# Patient Record
Sex: Female | Born: 1965 | Hispanic: No | Marital: Married | Smoking: Never smoker
Health system: Southern US, Community
[De-identification: ages and names within clinical notes are randomized; demographics above are authoritative.]

## PROBLEM LIST (undated history)

## (undated) DIAGNOSIS — T7840XA Allergy, unspecified, initial encounter: Secondary | ICD-10-CM

## (undated) DIAGNOSIS — R51 Headache: Secondary | ICD-10-CM

## (undated) DIAGNOSIS — K219 Gastro-esophageal reflux disease without esophagitis: Secondary | ICD-10-CM

## (undated) DIAGNOSIS — G8929 Other chronic pain: Secondary | ICD-10-CM

## (undated) DIAGNOSIS — R519 Headache, unspecified: Secondary | ICD-10-CM

## (undated) DIAGNOSIS — A159 Respiratory tuberculosis unspecified: Secondary | ICD-10-CM

## (undated) HISTORY — PX: TONSILLECTOMY: SUR1361

## (undated) HISTORY — DX: Headache, unspecified: R51.9

## (undated) HISTORY — PX: POLYPECTOMY: SHX149

## (undated) HISTORY — DX: Allergy, unspecified, initial encounter: T78.40XA

## (undated) HISTORY — PX: UPPER GASTROINTESTINAL ENDOSCOPY: SHX188

## (undated) HISTORY — DX: Respiratory tuberculosis unspecified: A15.9

## (undated) HISTORY — DX: Headache: R51

## (undated) HISTORY — DX: Gastro-esophageal reflux disease without esophagitis: K21.9

## (undated) HISTORY — DX: Other chronic pain: G89.29

## (undated) HISTORY — PX: WISDOM TOOTH EXTRACTION: SHX21

---

## 2002-11-05 ENCOUNTER — Other Ambulatory Visit: Admission: RE | Admit: 2002-11-05 | Discharge: 2002-11-05 | Payer: Self-pay | Admitting: Obstetrics and Gynecology

## 2004-01-14 ENCOUNTER — Other Ambulatory Visit: Admission: RE | Admit: 2004-01-14 | Discharge: 2004-01-14 | Payer: Self-pay | Admitting: Obstetrics and Gynecology

## 2005-03-08 ENCOUNTER — Other Ambulatory Visit: Admission: RE | Admit: 2005-03-08 | Discharge: 2005-03-08 | Payer: Self-pay | Admitting: Obstetrics and Gynecology

## 2007-05-24 ENCOUNTER — Encounter (INDEPENDENT_AMBULATORY_CARE_PROVIDER_SITE_OTHER): Payer: Self-pay | Admitting: Obstetrics and Gynecology

## 2007-05-24 ENCOUNTER — Inpatient Hospital Stay (HOSPITAL_COMMUNITY): Admission: RE | Admit: 2007-05-24 | Discharge: 2007-05-26 | Payer: Self-pay | Admitting: Obstetrics and Gynecology

## 2007-05-24 HISTORY — PX: LAPAROSCOPIC ASSISTED VAGINAL HYSTERECTOMY: SHX5398

## 2010-08-24 NOTE — Op Note (Signed)
NAME:  Alexandria Bowman, Alexandria Bowman              ACCOUNT NO.:  [id number]   MEDICAL RECORD NO.:  [id number]          PATIENT TYPE:  INP   LOCATION:  9316                          FACILITY:  [facility]   PHYSICIAN:  Michelle L. Grewal, M.D.DATE OF BIRTH:  [birth year]   DATE OF PROCEDURE:  05/24/2007  DATE OF DISCHARGE:                               OPERATIVE REPORT   PREOPERATIVE DIAGNOSIS:  Pelvic pain and history of endometriosis.   POSTOPERATIVE DIAGNOSIS:  Pelvic pain and history of endometriosis and  possible adenomyosis.   PROCEDURE:  Laparoscopic assisted vaginal hysterectomy and bilateral  salpingo-oophorectomy.   SURGEON:  Michelle L. Vincente Poli, M.D.   ASSISTANT:  Zelphia Cairo, M.D.   ANESTHESIA:  General.   DRAINS:  Foley.   ESTIMATED BLOOD LOSS:  200 mL approximately.   PROCEDURE IN DETAIL:  The patient was taken to the operating room where  she was identified and informed consent was obtained.  She was then  intubated in the operating room without difficulty.  She was prepped and  draped in the usual sterile fashion.  A Foley catheter was inserted and  draining clear urine.  Attention was turned to the abdomen after a  uterine manipulator was inserted. Local was infiltrated at the umbilicus  and just above the pubic symphysis. A small infraumbilical incision was  made.  The Veress needle was inserted and pneumoperitoneum was then  performed.  The Veress needle was removed.  An 11 mm trocar was inserted  one time. The laparoscope was introduced through the trocar sheath.  Everything appeared normal.  The patient was then placed in  Trendelenburg position.  A 5 mm trocar was placed suprapubically under  direct visualization.  Exam of the abdomen and pelvis revealed the  following.  There was evidence of old endometriosis in the cul-de-sac  that appeared to have resolved with some scarring in the cul-de-sac.  The uterus was boggy and I did notice when I inserted the uterine  manipulator  that she had a fair amount of descensus in the vagina.  There appeared to be congested vessels around the uterus.  She possibly  could have had adenomyosis, but of course, that will be pathology  pending.  The ovaries appeared normal.  The uterus and fallopian tubes  appeared normal.  We then first placed a grasper, identified the  fimbriated end on the right side, lifted it up, and identified the  ovary.  I then noticed the ureter and followed the course of the ureter.  We placed the gyrus instrument just beneath the ovary and made sure it  was very clear of the ureter.  We then transected this with excellent  hemostasis and carried that down to the round ligament.  There was no  bleeding, whatsoever.  This was done with excellent hemostasis.  The  ureter was peristalsing normally.  This was then done on the left side  in an identical fashion and noticed normal ureteral peristalsis after  this portion of the procedure. At this point, I then released the  pneumoperitoneum, kept the trocars in the abdomen, and went down  vaginally.  I placed a weighted speculum in the vagina.  A  circumferential incision was made around the cervix.  The posterior cul-  de-sac was entered sharply.  The anterior cul-de-sac was entered  sharply.  This was done without difficulty whatsoever.  Curved Heaney  clamps were placed just beside the cervix, each pedicle was cut, and  suture ligated using an 0 Vicryl suture.  We walked our way up the broad  ligament.  Each pedicle was secured using 0 Vicryl suture.  We then  retroflexed the uterus and removed the specimen.  The remainder of the  pedicles that had been clamped were then suture ligated using 0 Vicryl  suture.  There was no bleeding noted.  The posterior cuff was closed in  a running locked stitch from 3 o'clock to 9 o'clock using 0 Vicryl  suture.  The cuff was closed completely from anterior to posterior in a  running locked stitch using 0 Vicryl suture.   At this point, urine was  noted to be clear.  I then changed my gloves and went back up to the  abdomen in usual sterile fashion and replaced the pneumoperitoneum.  The  patient was then gently placed in Trendelenburg again for several  minutes and the pelvis was irrigated.  There was a small amount of blood  in the cul-de-sac. After irrigation with the [place], all pedicles were  inspected and there was no bleeding, whatsoever.  The vaginal cuff was  completely dry.  The pneumoperitoneum was released.  The pelvis was  inspected with release of the pneumoperitoneum and there was no bleeding  noted.  The trocars were removed after most of the gas was removed.  The  incisions were closed with 3-0 Vicryl interrupted and Dermabond skin  adhesive.  All sponge, lap and instrument counts were correct x2.  The  patient went to the recovery room in stable condition.      Michelle L. Vincente Poli, M.D.  Electronically Signed     MLG/MEDQ  D:  05/24/2007  T:  05/26/2007  Job:  [id number]

## 2010-08-27 NOTE — Discharge Summary (Signed)
NAME:  Alexandria Bowman, Alexandria Bowman              ACCOUNT NO.:  [id number]   MEDICAL RECORD NO.:  [id number]          PATIENT TYPE:  INP   LOCATION:  9316                          FACILITY:  [facility]   PHYSICIAN:  Zelphia Cairo, MD    DATE OF BIRTH:  1965-11-08   DATE OF ADMISSION:  05/24/2007  DATE OF DISCHARGE:  05/26/2007                               DISCHARGE SUMMARY   ADMISSION DIAGNOSIS:  Chronic pelvic pain.   PLANNED PROCEDURES:  Laparoscopic-assisted vaginal hysterectomy with  bilateral salpingo-oophorectomy.   HOSPITAL COURSE:  Alexandria Bowman was admitted to the hospital for  laparoscopic-assisted vaginal hysterectomy and bilateral salpingo-  oophorectomy.  Please see operative report for further details of this  surgery.  Following surgery, she had an uneventful postoperative course.  Her pain was well controlled with oral medications.  She remained  afebrile with stable vital signs.  Her Foley catheter was discontinued  following surgery, and she was able to urinate without difficulty.  Hemoglobin was stable at 9.7.  On postoperative day #2, her pain was  well controlled.  She was having some itching with Percocet, and  therefore, a prescription for Darvocet was given.  She was tolerating a  regular diet without nausea and vomiting.  She was ambulating and  urinating without difficulty.  She remained afebrile, and her incision  was clean, dry, and intact.  Abdomen was soft and nontender.  She was  then discharged home with plan to follow up in 1 week with Dr. Vincente Poli.      Zelphia Cairo, MD  Electronically Signed     GA/MEDQ  D:  06/26/2007  T:  06/26/2007  Job:  [id number]

## 2010-09-09 ENCOUNTER — Encounter: Payer: Self-pay | Admitting: Gastroenterology

## 2010-10-20 ENCOUNTER — Encounter: Payer: Self-pay | Admitting: Gastroenterology

## 2010-10-20 ENCOUNTER — Ambulatory Visit (INDEPENDENT_AMBULATORY_CARE_PROVIDER_SITE_OTHER): Payer: BC Managed Care – PPO | Admitting: Gastroenterology

## 2010-10-20 VITALS — BP 122/68 | HR 74 | Ht 65.0 in | Wt 182.0 lb

## 2010-10-20 DIAGNOSIS — K219 Gastro-esophageal reflux disease without esophagitis: Secondary | ICD-10-CM

## 2010-10-20 DIAGNOSIS — R1319 Other dysphagia: Secondary | ICD-10-CM

## 2010-10-20 DIAGNOSIS — R1013 Epigastric pain: Secondary | ICD-10-CM

## 2010-10-20 MED ORDER — OMEPRAZOLE 20 MG PO CPDR: 20.0000 mg | DELAYED_RELEASE_CAPSULE | Freq: Every day | ORAL | Status: DC

## 2010-10-20 NOTE — Patient Instructions (Signed)
Patient advised to avoid spicy, acidic, citrus, chocolate, mints, fruit and fruit juices.  Limit the intake of caffeine, alcohol and Soda.  Don't exercise too soon after eating.  Don't lie down within 3-4 hours of eating.  Elevate the head of your bed. You have been scheduled for a Upper Endoscopy. Separate instructions given. You have also been scheduled for a Barium Swallow with tablet at [facility]. Please arrive 15 minutes early. Nothing to eat or drink 3 hours prior to test. Pick up your prescription from your pharmacy.

## 2010-10-20 NOTE — Progress Notes (Signed)
History of Present Illness: This is a 45 year old female who is a 5 to 6 year history of frequent heartburn, regurgitation, frequent belching, intermittent upper and generalized abnormal pain associated with significant bloating and occasional urgent bowel movements.She recently started taking a probiotic without much change in symptoms. She has not tried acid suppressants except for intermittent TUMS which have not been effective. She denies constipation, diarrhea, melena, hematochezia, weight loss, vomiting. She denies NSAID usage.   Past Medical History  Diagnosis Date  . Endometriosis   . Chronic headaches   . GERD (gastroesophageal reflux disease)    Past Surgical History  Procedure Date  . Laparoscopic assisted vaginal hysterectomy 05/24/2007  . Tonsillectomy     reports that she has never smoked. She has never used smokeless tobacco. She reports that she does not drink alcohol or use illicit drugs. family history includes Diabetes in her father; Kidney disease in her maternal grandmother; and Uterine cancer in her maternal grandmother.  There is no history of Colon cancer. Allergies  Allergen Reactions  . Erythromycin   . Penicillins    Outpatient Encounter Prescriptions as of 10/20/2010  Medication Sig Dispense Refill  . AMBULATORY NON FORMULARY MEDICATION Estriol Micronized Cream. Use as directed       . Probiotic Product (PROBIOTIC FORMULA PO) Take by mouth daily.        . progesterone (PROMETRIUM) 100 MG capsule One capsule by mouth at bedtime        Review of Systems: Back pain, vision changes, fatigue, night sweats, but intermittent hoarseness. Pertinent positive and negative review of systems were noted in the above HPI section. All other review of systems were otherwise negative.  Physical Exam: General: Well developed , well nourished, no acute distress Head: Normocephalic and atraumatic Eyes:  sclerae anicteric, EOMI Ears: Normal auditory acuity Mouth: No deformity or  lesions Neck: Supple, no masses or thyromegaly Lungs: Clear throughout to auscultation Heart: Regular rate and rhythm; no murmurs, rubs or bruits Abdomen: Soft, non tender and non distended. No masses, hepatosplenomegaly or hernias noted. Normal Bowel sounds Rectal: Deferred Musculoskeletal: Symmetrical with no gross deformities  Skin: No lesions on visible extremities Pulses:  Normal pulses noted Extremities: No clubbing, cyanosis, edema or deformities noted Neurological: Alert oriented x 4, grossly nonfocal Cervical Nodes:  No significant cervical adenopathy Inguinal Nodes: No significant inguinal adenopathy Psychological:  Alert and cooperative. Normal mood and affect  Assessment and Recommendations:  1. GERD with intermittent solid food dysphagia. Rule out esophagitis and a stricture. She also has frequent upper and generalized abdominal bloating associated with urgent bowel movements. There may be a component of irritable bowel syndrome. If her symptoms do not respond to acid suppression consider further evaluation with blood work and possibly imaging studies. Begin standard antireflux measures and omeprazole 20 mg every morning. Schedule barium esophagram. Schedule for endoscopy. The risks, benefits, and alternatives to endoscopy with possible biopsy and possible dilation were discussed with the patient and they consent to proceed.   2. Colorectal cancer screening. Average risk. Colonoscopy at age 12.

## 2010-10-22 ENCOUNTER — Telehealth: Payer: Self-pay | Admitting: *Deleted

## 2010-10-22 ENCOUNTER — Ambulatory Visit (HOSPITAL_COMMUNITY)
Admission: RE | Admit: 2010-10-22 | Discharge: 2010-10-22 | Disposition: A | Discharge status: Home / Self Care | Payer: BC Managed Care – PPO | Source: Ambulatory Visit | Attending: Gastroenterology | Admitting: Gastroenterology

## 2010-10-22 DIAGNOSIS — R1013 Epigastric pain: Secondary | ICD-10-CM | POA: Insufficient documentation

## 2010-10-22 DIAGNOSIS — K219 Gastro-esophageal reflux disease without esophagitis: Secondary | ICD-10-CM

## 2010-10-22 DIAGNOSIS — R1319 Other dysphagia: Secondary | ICD-10-CM

## 2010-10-22 DIAGNOSIS — R1310 Dysphagia, unspecified: Secondary | ICD-10-CM | POA: Insufficient documentation

## 2010-10-22 NOTE — Telephone Encounter (Signed)
Message copied by Leonette Monarch on Fri Oct 22, 2010  2:19 PM ------      Message from: Claudette Head T      Created: Fri Oct 22, 2010  1:21 PM       normal

## 2010-10-26 NOTE — Telephone Encounter (Signed)
Notified patient of Barium Swallow results.

## 2010-11-26 ENCOUNTER — Encounter: Payer: Self-pay | Admitting: Gastroenterology

## 2010-11-26 ENCOUNTER — Ambulatory Visit (AMBULATORY_SURGERY_CENTER): Payer: BC Managed Care – PPO | Admitting: Gastroenterology

## 2010-11-26 DIAGNOSIS — K219 Gastro-esophageal reflux disease without esophagitis: Secondary | ICD-10-CM

## 2010-11-26 DIAGNOSIS — R1013 Epigastric pain: Secondary | ICD-10-CM

## 2010-11-26 DIAGNOSIS — R1319 Other dysphagia: Secondary | ICD-10-CM

## 2010-11-26 MED ORDER — GLYCOPYRROLATE 2 MG PO TABS: 2.0000 mg | ORAL_TABLET | Freq: Two times a day (BID) | ORAL | Status: DC

## 2010-11-26 MED ORDER — SODIUM CHLORIDE 0.9 % IV SOLN: 500.0000 mL | INTRAVENOUS | Status: DC

## 2010-11-26 NOTE — Patient Instructions (Signed)
Discharge instructions given with verbal understanding. Normal examination. Resume previous medications. Call office next 2-3 days to schedule an office appointment for 4-6 week.

## 2010-11-29 ENCOUNTER — Telehealth: Payer: Self-pay

## 2010-11-29 NOTE — Telephone Encounter (Signed)

## 2010-11-30 ENCOUNTER — Encounter: Payer: BC Managed Care – PPO | Admitting: Gastroenterology

## 2010-12-31 LAB — CBC
Hemoglobin: 9.7 — ABNORMAL LOW
MCV: 89.5
Platelets: 163
Platelets: 254
RDW: 11.9
WBC: 5.6
WBC: 6.5

## 2010-12-31 LAB — PREGNANCY, URINE: Preg Test, Ur: NEGATIVE

## 2011-08-24 ENCOUNTER — Other Ambulatory Visit: Payer: Self-pay | Admitting: Obstetrics and Gynecology

## 2011-11-02 ENCOUNTER — Ambulatory Visit (INDEPENDENT_AMBULATORY_CARE_PROVIDER_SITE_OTHER): Payer: BC Managed Care – PPO | Admitting: Gastroenterology

## 2011-11-02 ENCOUNTER — Other Ambulatory Visit: Payer: BC Managed Care – PPO

## 2011-11-02 ENCOUNTER — Encounter: Payer: Self-pay | Admitting: Gastroenterology

## 2011-11-02 VITALS — BP 92/64 | HR 64 | Ht 65.0 in | Wt 160.0 lb

## 2011-11-02 DIAGNOSIS — R197 Diarrhea, unspecified: Secondary | ICD-10-CM

## 2011-11-02 DIAGNOSIS — K219 Gastro-esophageal reflux disease without esophagitis: Secondary | ICD-10-CM

## 2011-11-02 DIAGNOSIS — R109 Unspecified abdominal pain: Secondary | ICD-10-CM

## 2011-11-02 MED ORDER — GLYCOPYRROLATE 2 MG PO TABS: ORAL_TABLET | ORAL | Status: DC

## 2011-11-02 NOTE — Patient Instructions (Addendum)
Your physician has requested that you go to the basement for the following lab work before leaving today: Celiac panel. We have sent the following medications to your pharmacy for you to pick up at your convenience: Glycopyrrolate. cc: Marcelle Overlie, MD

## 2011-11-02 NOTE — Progress Notes (Signed)
History of Present Illness: This is a 46 year old female with GERD. She underwent endoscopy in 11/2010 that was normal. Her reflux symptoms have been under excellent control on daily omeprazole. She has frequent generalized abdominal cramping and urgent diarrhea. The symptoms are often brought on by meals but can occur at other times as well. She was treated with glycopyrrolate but this led to drowsiness and dry mouth so she discontinued it. Her symptoms are unchanged over the past year. A recent thyroid panel at Dr. Lynnell Dike office was negative. Denies weight loss, constipation, change in stool caliber, melena, hematochezia, nausea, vomiting, dysphagia, reflux symptoms, chest pain.  Current Medications, Allergies, Past Medical History, Past Surgical History, Family History and Social History were reviewed in [employer] record.  Physical Exam: General: Well developed , well nourished, no acute distress Head: Normocephalic and atraumatic Eyes:  sclerae anicteric, EOMI Ears: Normal auditory acuity Mouth: No deformity or lesions Lungs: Clear throughout to auscultation Heart: Regular rate and rhythm; no murmurs, rubs or bruits Abdomen: Soft, non tender and non distended. No masses, hepatosplenomegaly or hernias noted. Normal Bowel sounds Musculoskeletal: Symmetrical with no gross deformities  Pulses:  Normal pulses noted Extremities: No clubbing, cyanosis, edema or deformities noted Neurological: Alert oriented x 4, grossly nonfocal Psychological:  Alert and cooperative. Normal mood and affect  Assessment and Recommendations:  1. GERD.  Continue standard antireflux measures and omeprazole 20 mg every morning.   2. Abdominal pain, bloating and diarrhea. Presumed irritable bowel syndrome. Obtain a celiac panel and stool Hemoccults. Trial of a reduced dose of glycopyrrolate at 1 mg daily or twice a day. Consider a trial of probiotics or another anti-spasmodic. Return office  visit in 4-6 weeks. If symptoms have not adequately responded consider further evaluation with colonoscopy.  3. Colorectal cancer screening. Average risk. Colonoscopy at age 72.

## 2011-11-03 LAB — CELIAC PANEL 10
Gliadin IgG: 5.5 U/mL (ref <20)
IgA: 394 mg/dL — ABNORMAL HIGH (ref 69–380)

## 2011-11-07 ENCOUNTER — Other Ambulatory Visit: Payer: Self-pay | Admitting: Gastroenterology

## 2011-11-07 DIAGNOSIS — R1013 Epigastric pain: Secondary | ICD-10-CM

## 2011-11-07 DIAGNOSIS — K219 Gastro-esophageal reflux disease without esophagitis: Secondary | ICD-10-CM

## 2011-11-07 DIAGNOSIS — R1319 Other dysphagia: Secondary | ICD-10-CM

## 2011-11-07 MED ORDER — OMEPRAZOLE 20 MG PO CPDR: 20.0000 mg | DELAYED_RELEASE_CAPSULE | Freq: Every day | ORAL | Status: DC

## 2011-11-07 NOTE — Telephone Encounter (Signed)
Prescription sent to patient's pharmacy.

## 2012-08-24 ENCOUNTER — Other Ambulatory Visit: Payer: Self-pay | Admitting: Obstetrics and Gynecology

## 2012-11-22 ENCOUNTER — Other Ambulatory Visit: Payer: Self-pay | Admitting: Gastroenterology

## 2012-11-22 NOTE — Telephone Encounter (Signed)
NEEDS OFFICE VISIT FOR ANY FURTHER REFILLS! 

## 2013-02-09 ENCOUNTER — Other Ambulatory Visit: Payer: Self-pay | Admitting: Gastroenterology

## 2013-11-06 ENCOUNTER — Other Ambulatory Visit: Payer: Self-pay | Admitting: Obstetrics and Gynecology

## 2013-11-07 LAB — CYTOLOGY - PAP

## 2014-11-18 ENCOUNTER — Other Ambulatory Visit: Payer: Self-pay | Admitting: Obstetrics and Gynecology

## 2014-11-19 LAB — CYTOLOGY - PAP

## 2016-01-19 ENCOUNTER — Encounter: Payer: Self-pay | Admitting: Gastroenterology

## 2016-02-26 ENCOUNTER — Encounter: Payer: Self-pay | Admitting: Gastroenterology

## 2016-04-07 ENCOUNTER — Ambulatory Visit: Payer: Self-pay | Admitting: Gastroenterology

## 2016-04-19 ENCOUNTER — Ambulatory Visit (AMBULATORY_SURGERY_CENTER): Payer: Self-pay

## 2016-04-19 VITALS — Ht 64.0 in | Wt 186.6 lb

## 2016-04-19 DIAGNOSIS — Z1211 Encounter for screening for malignant neoplasm of colon: Secondary | ICD-10-CM

## 2016-04-19 MED ORDER — NA SULFATE-K SULFATE-MG SULF 17.5-3.13-1.6 GM/177ML PO SOLN
ORAL | 0 refills | Status: DC
Start: 2016-04-19 — End: 2016-06-14

## 2016-04-19 NOTE — Progress Notes (Signed)
Per pt, no allergies to soy or egg products.Pt not taking any weight loss meds or using  O2 at home. 

## 2016-05-04 ENCOUNTER — Encounter: Payer: Self-pay | Admitting: Gastroenterology

## 2016-06-03 ENCOUNTER — Encounter: Payer: Self-pay | Admitting: Gastroenterology

## 2016-06-14 ENCOUNTER — Ambulatory Visit (AMBULATORY_SURGERY_CENTER): Payer: BLUE CROSS/BLUE SHIELD | Admitting: Gastroenterology

## 2016-06-14 ENCOUNTER — Encounter: Payer: Self-pay | Admitting: Gastroenterology

## 2016-06-14 VITALS — BP 123/85 | HR 72 | Temp 98.6°F | Resp 13 | Ht 64.0 in | Wt 186.0 lb

## 2016-06-14 DIAGNOSIS — D123 Benign neoplasm of transverse colon: Secondary | ICD-10-CM | POA: Diagnosis not present

## 2016-06-14 DIAGNOSIS — Z1212 Encounter for screening for malignant neoplasm of rectum: Secondary | ICD-10-CM

## 2016-06-14 DIAGNOSIS — Z1211 Encounter for screening for malignant neoplasm of colon: Secondary | ICD-10-CM

## 2016-06-14 DIAGNOSIS — D125 Benign neoplasm of sigmoid colon: Secondary | ICD-10-CM

## 2016-06-14 HISTORY — PX: COLONOSCOPY: SHX174

## 2016-06-14 MED ORDER — HYOSCYAMINE SULFATE 0.125 MG SL SUBL: SUBLINGUAL_TABLET | SUBLINGUAL | 5 refills | Status: AC

## 2016-06-14 MED ORDER — SODIUM CHLORIDE 0.9 % IV SOLN: 500.0000 mL | INTRAVENOUS | Status: DC

## 2016-06-14 NOTE — Patient Instructions (Signed)
YOU HAD AN ENDOSCOPIC PROCEDURE TODAY AT [facility]:   Refer to the procedure report that was given to you for any specific questions about what was found during the examination.  If the procedure report does not answer your questions, please call your gastroenterologist to clarify.  If you requested that your care partner not be given the details of your procedure findings, then the procedure report has been included in a sealed envelope for you to review at your convenience later.  YOU SHOULD EXPECT: Some feelings of bloating in the abdomen. Passage of more gas than usual.  Walking can help get rid of the air that was put into your GI tract during the procedure and reduce the bloating. If you had a lower endoscopy (such as a colonoscopy or flexible sigmoidoscopy) you may notice spotting of blood in your stool or on the toilet paper. If you underwent a bowel prep for your procedure, you may not have a normal bowel movement for a few days.  Please Note:  You might notice some irritation and congestion in your nose or some drainage.  This is from the oxygen used during your procedure.  There is no need for concern and it should clear up in a day or so.  SYMPTOMS TO REPORT IMMEDIATELY:   Following lower endoscopy (colonoscopy or flexible sigmoidoscopy):  Excessive amounts of blood in the stool  Significant tenderness or worsening of abdominal pains  Swelling of the abdomen that is new, acute  Fever of 100F or higher  For urgent or emergent issues, a gastroenterologist can be reached at any hour by calling [phone].   DIET:  We do recommend a small meal at first, but then you may proceed to your regular diet.  Drink plenty of fluids but you should avoid alcoholic beverages for 24 hours.  ACTIVITY:  You should plan to take it easy for the rest of today and you should NOT DRIVE or use heavy machinery until tomorrow (because of the sedation medicines used during the test).     FOLLOW UP: Our staff will call the number listed on your records the next business day following your procedure to check on you and address any questions or concerns that you may have regarding the information given to you following your procedure. If we do not reach you, we will leave a message.  However, if you are feeling well and you are not experiencing any problems, there is no need to return our call.  We will assume that you have returned to your regular daily activities without incident.  If any biopsies were taken you will be contacted by phone or by letter within the next 1-3 weeks.  Please call us at [phone] if you have not heard about the biopsies in 3 weeks.    SIGNATURES/CONFIDENTIALITY: You and/or your care partner have signed paperwork which will be entered into your electronic medical record.  These signatures attest to the fact that that the information above on your After Visit Summary has been reviewed and is understood.  Full responsibility of the confidentiality of this discharge information lies with you and/or your care-partner.  MEDICATIONS:  Levsin (hyoscyamine) 0.125mg  1-2 tabs by mouth under the tongue every 4 hours as needed for abdominal pain and diarrhea. No Aspirin, Ibuprofen, Naproxen, or other non-steroidal anti-inflammatory drugs for 2 weeks after polyp removal. Continue present medications.

## 2016-06-14 NOTE — Progress Notes (Signed)
Called to room to assist during endoscopic procedure.  Patient ID and intended procedure confirmed with present staff. Received instructions for my participation in the procedure from the performing physician.  

## 2016-06-14 NOTE — Progress Notes (Signed)
Spontaneous respirations throughout. VSS. Resting comfortably. To PACU on room air. Report to  Sara RN. 

## 2016-06-14 NOTE — Op Note (Signed)
[facility] Patient Name: Alexandria Bowman Procedure Date: 06/14/2016 10:10 AM MRN: [id number] Endoscopist: Ladene Artist , MD Age: 51 Referring MD:  Date of Birth: 1965-08-04 Gender: Female Account #: [id number] Procedure:                Colonoscopy Indications:              Screening for colorectal malignant neoplasm, This                            is the patient's first colonoscopy Medicines:                Monitored Anesthesia Care Procedure:                Pre-Anesthesia Assessment:                           - Prior to the procedure, a History and Physical                            was performed, and patient medications and                            allergies were reviewed. The patient's tolerance of                            previous anesthesia was also reviewed. The risks                            and benefits of the procedure and the sedation                            options and risks were discussed with the patient.                            All questions were answered, and informed consent                            was obtained. Prior Anticoagulants: The patient has                            taken no previous anticoagulant or antiplatelet                            agents. ASA Grade Assessment: II - A patient with                            mild systemic disease. After reviewing the risks                            and benefits, the patient was deemed in                            satisfactory condition to undergo the procedure.  After obtaining informed consent, the colonoscope                            was passed under direct vision. Throughout the                            procedure, the patient's blood pressure, pulse, and                            oxygen saturations were monitored continuously. The                            Colonoscope was introduced through the anus and                            advanced to the the  cecum, identified by                            appendiceal orifice and ileocecal valve. The                            ileocecal valve, appendiceal orifice, and rectum                            were photographed. The quality of the bowel                            preparation was adequate after extensive lavage and                            suctioning. The colonoscopy was performed without                            difficulty. The patient tolerated the procedure                            well. Scope In: 10:25:32 AM Scope Out: 10:45:47 AM Scope Withdrawal Time: 0 hours 17 minutes 9 seconds  Total Procedure Duration: 0 hours 20 minutes 15 seconds  Findings:                 A 12 mm polyp was found in the sigmoid colon. The                            polyp was pedunculated. The polyp was removed with                            a hot snare. Resection and retrieval were complete.                           A 5 mm polyp was found in the sigmoid colon. The                            polyp was sessile. The polyp was  removed with a                            cold biopsy forceps. Resection and retrieval were                            complete.                           A 7 mm polyp was found in the transverse colon. The                            polyp was sessile. The polyp was removed with a                            cold snare. Resection and retrieval were complete.                           A diffuse area of mild melanosis was found in the                            right colon.                           The exam was otherwise without abnormality on                            direct and retroflexion views. Complications:            No immediate complications. Estimated blood loss:                            None. Estimated Blood Loss:     Estimated blood loss: none. Impression:               - One 12 mm polyp in the sigmoid colon, removed                            with a hot snare.  Resected and retrieved.                           - One 5 mm polyp in the sigmoid colon, removed with                            a cold biopsy forceps. Resected and retrieved.                           - One 7 mm polyp in the transverse colon, removed                            with a cold snare. Resected and retrieved.                           - Melanosis in the colon.                           -  The examination was otherwise normal on direct                            and retroflexion views. Recommendation:           - Repeat colonoscopy in 3 - 5 years with a more                            extensive bowel prep for surveillance, timing                            pending pathology review.                           - Patient has a contact number available for                            emergencies. The signs and symptoms of potential                            delayed complications were discussed with the                            patient. Return to normal activities tomorrow.                            Written discharge instructions were provided to the                            patient.                           - Resume previous diet.                           - Continue present medications.                           - Await pathology results.                           - No aspirin, ibuprofen, naproxen, or other                            non-steroidal anti-inflammatory drugs for 2 weeks                            after polyp removal.                           - Levsin (hyoscyamine) 0.125 mg 1-2 tabs PO/SL q 4                            hours prn abd pain, diarrhea, #40, 5 refills. Ladene Artist, MD 06/14/2016 10:52:02 AM This report has been signed electronically.

## 2016-06-15 ENCOUNTER — Telehealth: Payer: Self-pay

## 2016-06-15 NOTE — Telephone Encounter (Signed)
Left message on answering machine. 

## 2016-06-16 ENCOUNTER — Telehealth: Payer: Self-pay | Admitting: *Deleted

## 2016-06-16 NOTE — Telephone Encounter (Signed)
  Follow up Call-  Call back number 06/14/2016  Post procedure Call Back phone  # [phone] cell  Permission to leave phone message Yes  Some recent data might be hidden     Patient questions:  Do you have a fever, pain , or abdominal swelling? No. Pain Score  0 *  Have you tolerated food without any problems? Yes.    Have you been able to return to your normal activities? Yes.    Do you have any questions about your discharge instructions: Diet   No. Medications  No. Follow up visit  No.  Do you have questions or concerns about your Care? No.  Actions: * If pain score is 4 or above: No action needed, pain <4.

## 2016-06-29 ENCOUNTER — Encounter: Payer: Self-pay | Admitting: Gastroenterology

## 2019-02-13 ENCOUNTER — Other Ambulatory Visit: Payer: Self-pay | Admitting: Obstetrics and Gynecology

## 2019-02-13 DIAGNOSIS — R928 Other abnormal and inconclusive findings on diagnostic imaging of breast: Secondary | ICD-10-CM

## 2019-02-14 ENCOUNTER — Ambulatory Visit: Payer: BLUE CROSS/BLUE SHIELD

## 2019-02-14 ENCOUNTER — Other Ambulatory Visit: Payer: Self-pay

## 2019-02-14 ENCOUNTER — Ambulatory Visit
Admission: RE | Admit: 2019-02-14 | Discharge: 2019-02-14 | Disposition: A | Discharge status: Home / Self Care | Payer: BC Managed Care – PPO | Source: Ambulatory Visit | Attending: Obstetrics and Gynecology | Admitting: Obstetrics and Gynecology

## 2019-02-14 DIAGNOSIS — R928 Other abnormal and inconclusive findings on diagnostic imaging of breast: Secondary | ICD-10-CM

## 2020-02-10 ENCOUNTER — Other Ambulatory Visit: Payer: Self-pay

## 2020-02-10 ENCOUNTER — Ambulatory Visit (AMBULATORY_SURGERY_CENTER): Payer: Self-pay | Admitting: *Deleted

## 2020-02-10 VITALS — Ht 64.0 in | Wt 178.0 lb

## 2020-02-10 DIAGNOSIS — Z86010 Personal history of colonic polyps: Secondary | ICD-10-CM

## 2020-02-10 MED ORDER — PLENVU 140 G PO SOLR: 1.0000 | Freq: Once | ORAL | 0 refills | Status: AC

## 2020-02-10 NOTE — Progress Notes (Signed)
Patient is here in-person for PV. Patient denies any allergies to eggs or soy. Patient denies any problems with anesthesia/sedation. Patient denies any oxygen use at home. Patient denies taking any diet/weight loss medications or blood thinners. Patient is not being treated for MRSA or C-diff. Patient is aware of our care-partner policy and [id number]-81 safety protocol. COVID-19 vaccines completed on 01/04/2020 x3 , per patient.   Prep Prescription coupon given to the patient.

## 2020-02-12 ENCOUNTER — Encounter: Payer: Self-pay | Admitting: Gastroenterology

## 2020-02-24 ENCOUNTER — Ambulatory Visit (AMBULATORY_SURGERY_CENTER): Payer: BC Managed Care – PPO | Admitting: Gastroenterology

## 2020-02-24 ENCOUNTER — Encounter: Payer: Self-pay | Admitting: Gastroenterology

## 2020-02-24 ENCOUNTER — Other Ambulatory Visit: Payer: Self-pay

## 2020-02-24 VITALS — BP 108/71 | HR 86 | Temp 97.5°F | Resp 13 | Ht 64.0 in | Wt 178.0 lb

## 2020-02-24 DIAGNOSIS — Z86010 Personal history of colonic polyps: Secondary | ICD-10-CM

## 2020-02-24 MED ORDER — SODIUM CHLORIDE 0.9 % IV SOLN: 500.0000 mL | Freq: Once | INTRAVENOUS | Status: DC

## 2020-02-24 NOTE — Op Note (Signed)
[place] Patient Name: Alexandria Bowman Procedure Date: 02/24/2020 10:34 AM MRN: [id number] Endoscopist: Ladene Artist , MD Age: 53 Referring MD:  Date of Birth: 1965/11/27 Gender: Female Account #: [id number] Procedure:                Colonoscopy Indications:              Surveillance: Personal history of adenomatous                            polyps on last colonoscopy 3 years ago Medicines:                Monitored Anesthesia Care Procedure:                Pre-Anesthesia Assessment:                           - Prior to the procedure, a History and Physical                            was performed, and patient medications and                            allergies were reviewed. The patient's tolerance of                            previous anesthesia was also reviewed. The risks                            and benefits of the procedure and the sedation                            options and risks were discussed with the patient.                            All questions were answered, and informed consent                            was obtained. Prior Anticoagulants: The patient has                            taken no previous anticoagulant or antiplatelet                            agents. ASA Grade Assessment: II - A patient with                            mild systemic disease. After reviewing the risks                            and benefits, the patient was deemed in                            satisfactory condition to undergo the procedure.  After obtaining informed consent, the colonoscope                            was passed under direct vision. Throughout the                            procedure, the patient's blood pressure, pulse, and                            oxygen saturations were monitored continuously. The                            Colonoscope was introduced through the anus and                            advanced to the the  cecum, identified by                            appendiceal orifice and ileocecal valve. The                            ileocecal valve, appendiceal orifice, and rectum                            were photographed. The quality of the bowel                            preparation was good after extensive lavage,                            suction. The colonoscopy was performed without                            difficulty. The patient tolerated the procedure                            well. Scope In: 10:40:54 AM Scope Out: 11:01:32 AM Scope Withdrawal Time: 0 hours 16 minutes 35 seconds  Total Procedure Duration: 0 hours 20 minutes 38 seconds  Findings:                 The perianal and digital rectal examinations were                            normal.                           A patchy area of mild melanosis was found in the                            ascending colon and in the cecum.                           The exam was otherwise without abnormality on  direct and retroflexion views. Complications:            No immediate complications. Estimated blood loss:                            None. Estimated Blood Loss:     Estimated blood loss: none. Impression:               - Mild melanosis in the colon.                           - The examination was otherwise normal on direct                            and retroflexion views.                           - No specimens collected. Recommendation:           - Repeat colonoscopy in 5 years for surveillance                            with an extended bowel prep.                           - Patient has a contact number available for                            emergencies. The signs and symptoms of potential                            delayed complications were discussed with the                            patient. Return to normal activities tomorrow.                            Written discharge instructions were  provided to the                            patient.                           - Resume previous diet.                           - Continue present medications. Ladene Artist, MD 02/24/2020 11:04:52 AM This report has been signed electronically.

## 2020-02-24 NOTE — Progress Notes (Signed)
A/ox3, pleased with MAC, report to RN 

## 2020-02-24 NOTE — Patient Instructions (Signed)
Resume previous diet Continue current medications  YOU HAD AN ENDOSCOPIC PROCEDURE TODAY AT THE [facility]:   Refer to the procedure report that was given to you for any specific questions about what was found during the examination.  If the procedure report does not answer your questions, please call your gastroenterologist to clarify.  If you requested that your care partner not be given the details of your procedure findings, then the procedure report has been included in a sealed envelope for you to review at your convenience later.  YOU SHOULD EXPECT: Some feelings of bloating in the abdomen. Passage of more gas than usual.  Walking can help get rid of the air that was put into your GI tract during the procedure and reduce the bloating. If you had a lower endoscopy (such as a colonoscopy or flexible sigmoidoscopy) you may notice spotting of blood in your stool or on the toilet paper. If you underwent a bowel prep for your procedure, you may not have a normal bowel movement for a few days.  Please Note:  You might notice some irritation and congestion in your nose or some drainage.  This is from the oxygen used during your procedure.  There is no need for concern and it should clear up in a day or so.  SYMPTOMS TO REPORT IMMEDIATELY:  Following lower endoscopy (colonoscopy or flexible sigmoidoscopy):  Excessive amounts of blood in the stool  Significant tenderness or worsening of abdominal pains  Swelling of the abdomen that is new, acute  Fever of 100F or higher  For urgent or emergent issues, a gastroenterologist can be reached at any hour by calling [phone]. Do not use MyChart messaging for urgent concerns.   DIET:  We do recommend a small meal at first, but then you may proceed to your regular diet.  Drink plenty of fluids but you should avoid alcoholic beverages for 24 hours.  ACTIVITY:  You should plan to take it easy for the rest of today and you should NOT  DRIVE or use heavy machinery until tomorrow (because of the sedation medicines used during the test).    FOLLOW UP: Our staff will call the number listed on your records 48-72 hours following your procedure to check on you and address any questions or concerns that you may have regarding the information given to you following your procedure. If we do not reach you, we will leave a message.  We will attempt to reach you two times.  During this call, we will ask if you have developed any symptoms of COVID 19. If you develop any symptoms (ie: fever, flu-like symptoms, shortness of breath, cough etc.) before then, please call [phone].  If you test positive for Covid 19 in the 2 weeks post procedure, please call and report this information to us.    If any biopsies were taken you will be contacted by phone or by letter within the next 1-3 weeks.  Please call us at [phone] if you have not heard about the biopsies in 3 weeks.   SIGNATURES/CONFIDENTIALITY: You and/or your care partner have signed paperwork which will be entered into your electronic medical record.  These signatures attest to the fact that that the information above on your After Visit Summary has been reviewed and is understood.  Full responsibility of the confidentiality of this discharge information lies with you and/or your care-partner.  

## 2020-02-24 NOTE — Progress Notes (Signed)
VS by CW  No changes to medical or social hx since previsit.  

## 2020-02-26 ENCOUNTER — Telehealth: Payer: Self-pay

## 2020-02-26 NOTE — Telephone Encounter (Signed)
Left message on follow up call. 

## 2020-02-26 NOTE — Telephone Encounter (Signed)
  Follow up Call-  Call back number 02/24/2020  Post procedure Call Back phone  # [phone]  Permission to leave phone message Yes  Some recent data might be hidden     Patient questions:  Do you have a fever, pain , or abdominal swelling? No. Pain Score  0 *  Have you tolerated food without any problems? Yes.    Have you been able to return to your normal activities? Yes.    Do you have any questions about your discharge instructions: Diet   No. Medications  No. Follow up visit  No.  Do you have questions or concerns about your Care? No.  Actions: * If pain score is 4 or above: No action needed, pain <4.   1. Have you developed a fever since your procedure? No   2.   Have you had an respiratory symptoms (SOB or cough) since your procedure? No   3.   Have you tested positive for COVID 19 since your procedure? No   4.   Have you had any family members/close contacts diagnosed with the COVID 19 since your procedure?  No    If yes to any of these questions please route to Joylene John, RN and Joella Prince, RN

## 2020-03-23 ENCOUNTER — Other Ambulatory Visit: Payer: Self-pay | Admitting: Obstetrics and Gynecology

## 2020-03-23 DIAGNOSIS — R928 Other abnormal and inconclusive findings on diagnostic imaging of breast: Secondary | ICD-10-CM

## 2020-03-30 ENCOUNTER — Other Ambulatory Visit: Payer: Self-pay

## 2020-03-30 ENCOUNTER — Ambulatory Visit
Admission: RE | Admit: 2020-03-30 | Discharge: 2020-03-30 | Disposition: A | Discharge status: Home / Self Care | Payer: BC Managed Care – PPO | Source: Ambulatory Visit | Attending: Obstetrics and Gynecology | Admitting: Obstetrics and Gynecology

## 2020-03-30 DIAGNOSIS — R928 Other abnormal and inconclusive findings on diagnostic imaging of breast: Secondary | ICD-10-CM

## 2020-12-25 IMAGING — MG MM DIGITAL DIAGNOSTIC UNILAT*L* W/ TOMO W/ CAD
6 of 12 series · 6 of 36 positions shown · non-contrast
Comparison: Previous exam(s).

CLINICAL DATA: Patient presents today recall from screen for a
possible left breast asymmetry and a possible left breast mass.

EXAM:
DIGITAL DIAGNOSTIC UNILATERAL LEFT MAMMOGRAM WITH CAD AND TOMO

[L CC synth-2D (1 of 3)]
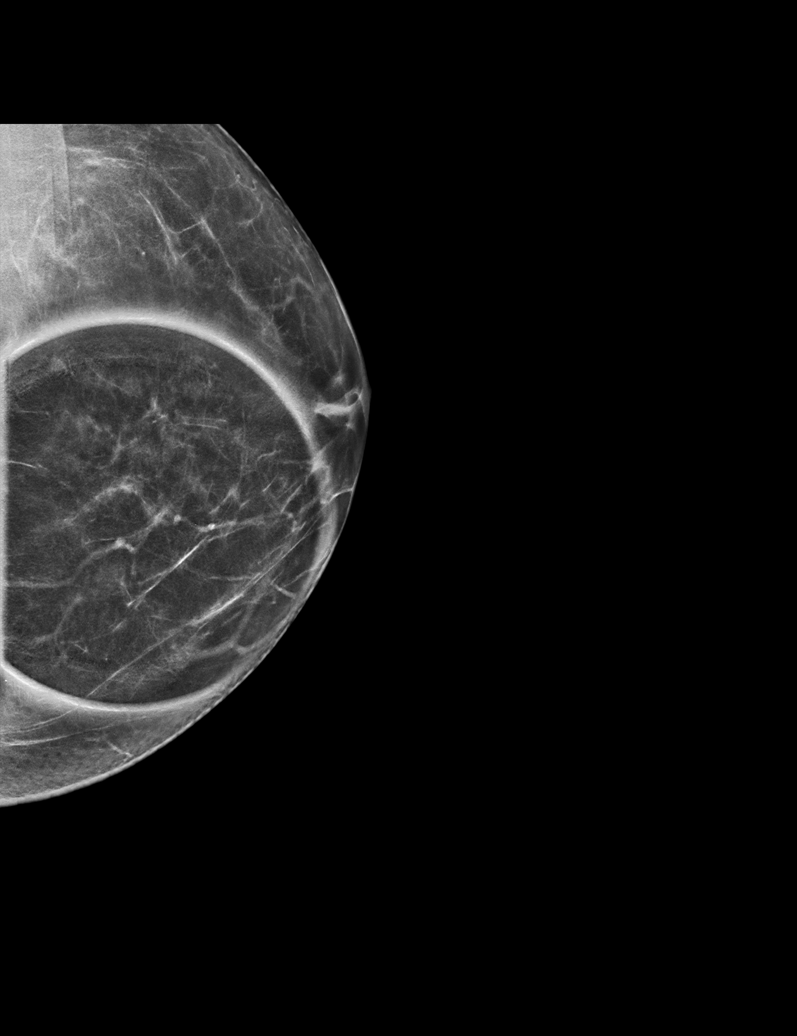

[L MLO synth-2D (1 of 2)]
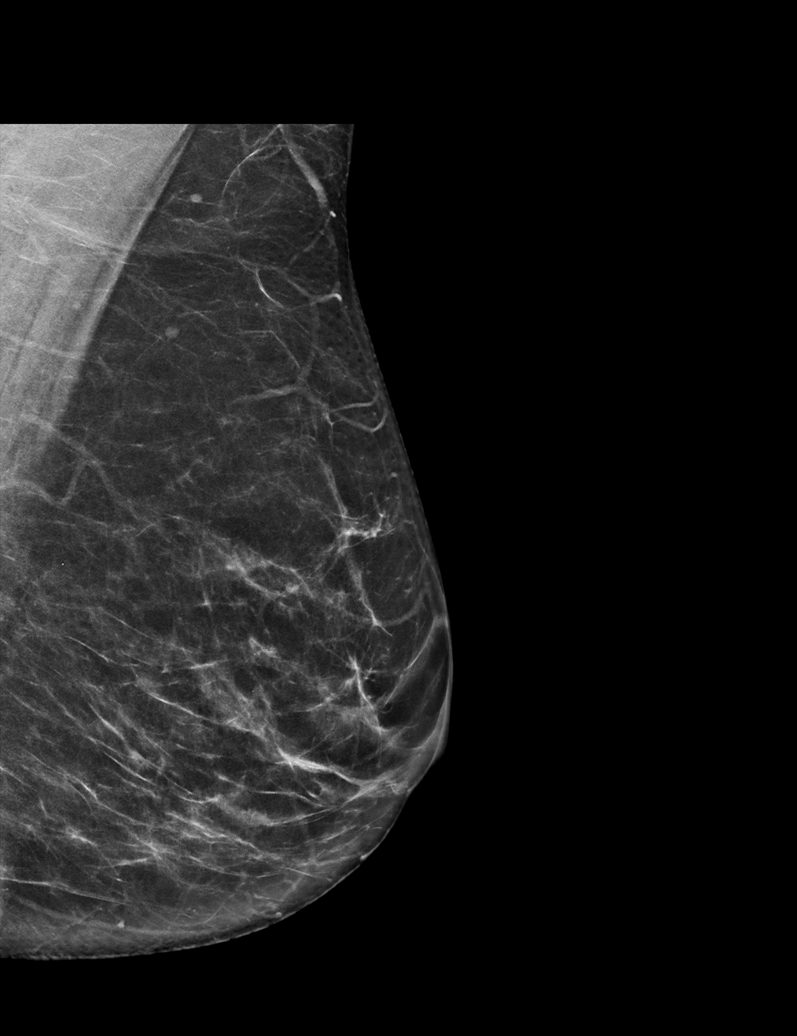

[L CC synth-2D (2 of 3)]
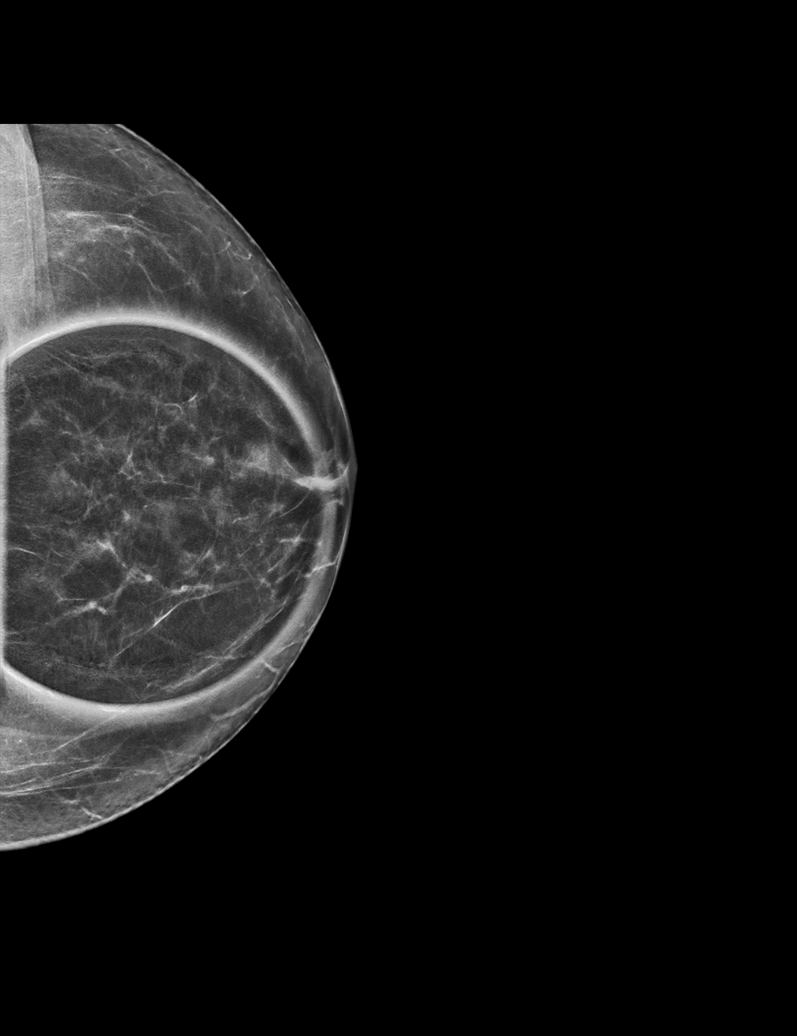

[L ML synth-2D]
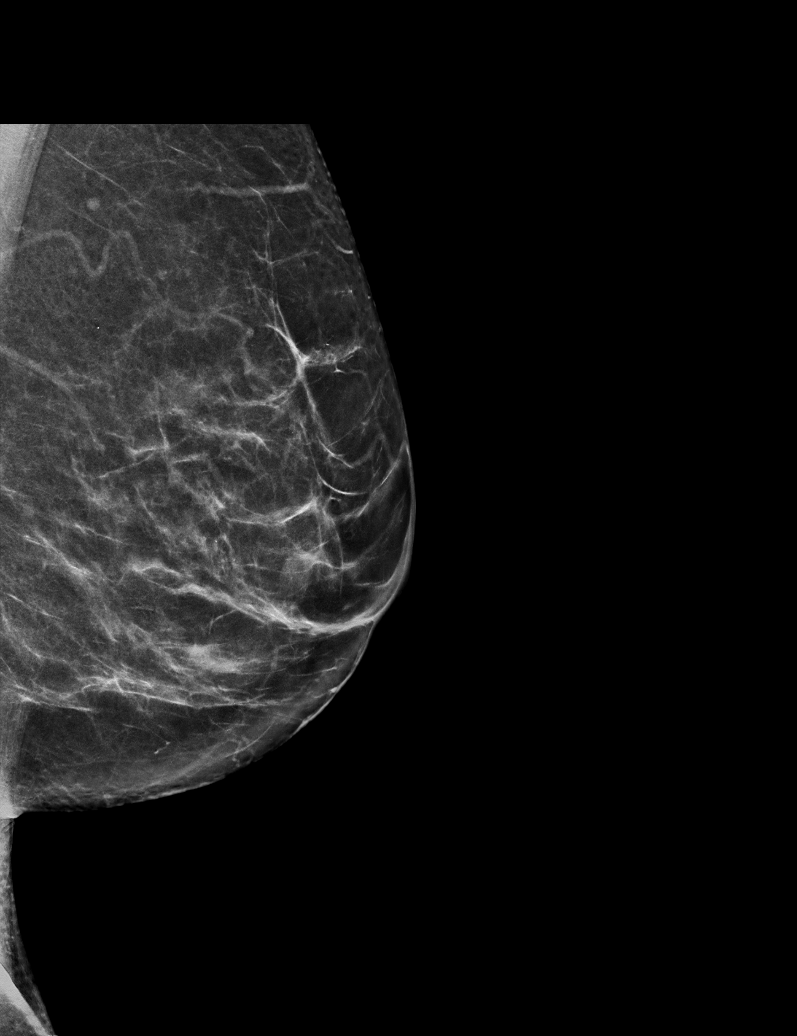

[L CC synth-2D (3 of 3)]
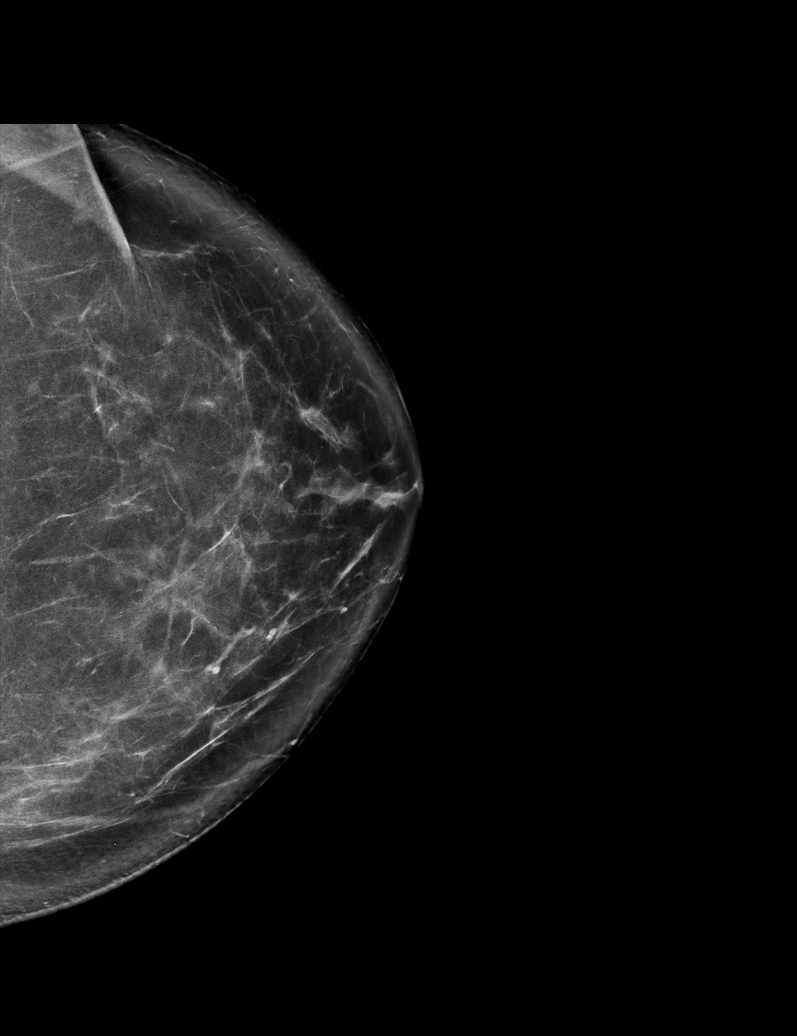

[L MLO synth-2D (2 of 2)]
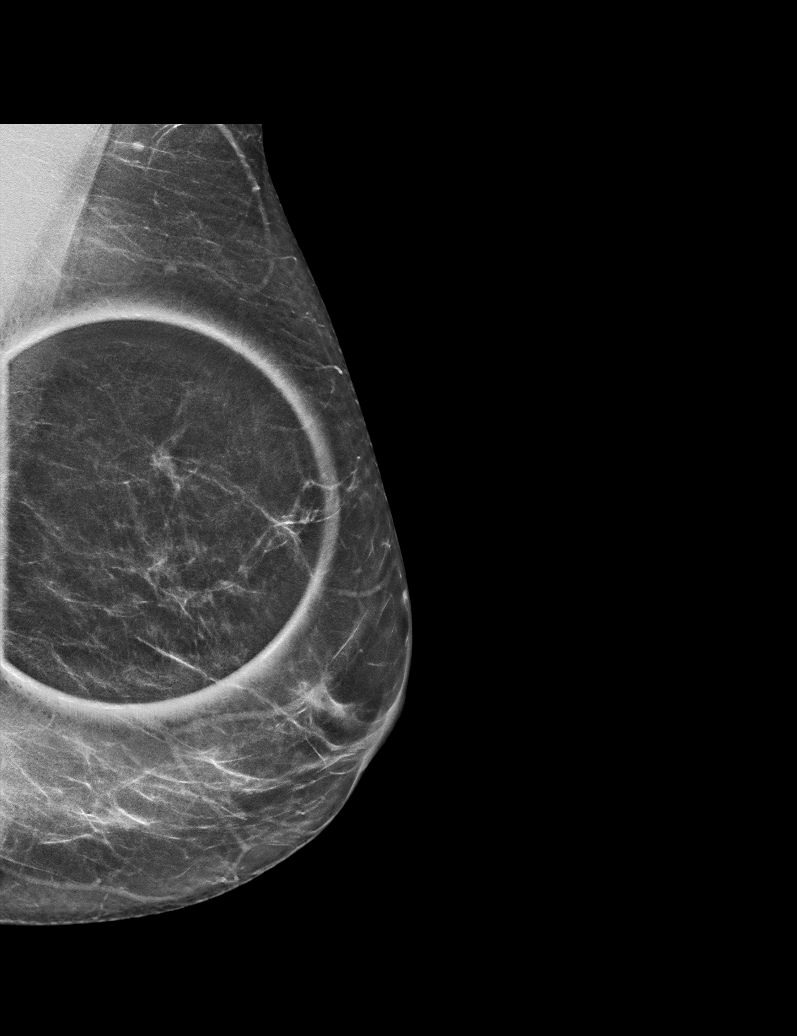

[6 of 36 positions shown; findings below may reference images not displayed]

ACR Breast Density Category b: There are scattered areas of
fibroglandular density.
FINDINGS: Additional spot compression tomosynthesis and full field views were
performed for the questioned asymmetry and questioned mass in the
left breast. On the additional imaging these findings do not
persist, and are consistent with overlapping fibroglandular tissue.
No mammographic evidence of malignancy in the left breast.

Mammographic images were processed with CAD.
IMPRESSION: No mammographic evidence of malignancy in the left breast.

RECOMMENDATION:
Screening mammogram in one year.(Code:[id number])

I have discussed the findings and recommendations with the patient.
If applicable, a reminder letter will be sent to the patient
regarding the next appointment.

BI-RADS CATEGORY  1: Negative.

## 2022-02-08 IMAGING — US US BREAST*L* LIMITED INC AXILLA
1 series · 6 of 6 positions shown · non-contrast
Comparison: Previous exam(s).

CLINICAL DATA: 54-year-old female for further evaluation of LEFT
breast mass on screening mammogram.

EXAM:
ULTRASOUND OF THE LEFT BREAST

[Series 1: us breast*left* limited inc axilla · 0.07mm/px · 6 of 6 slices shown]
[im 1/6]
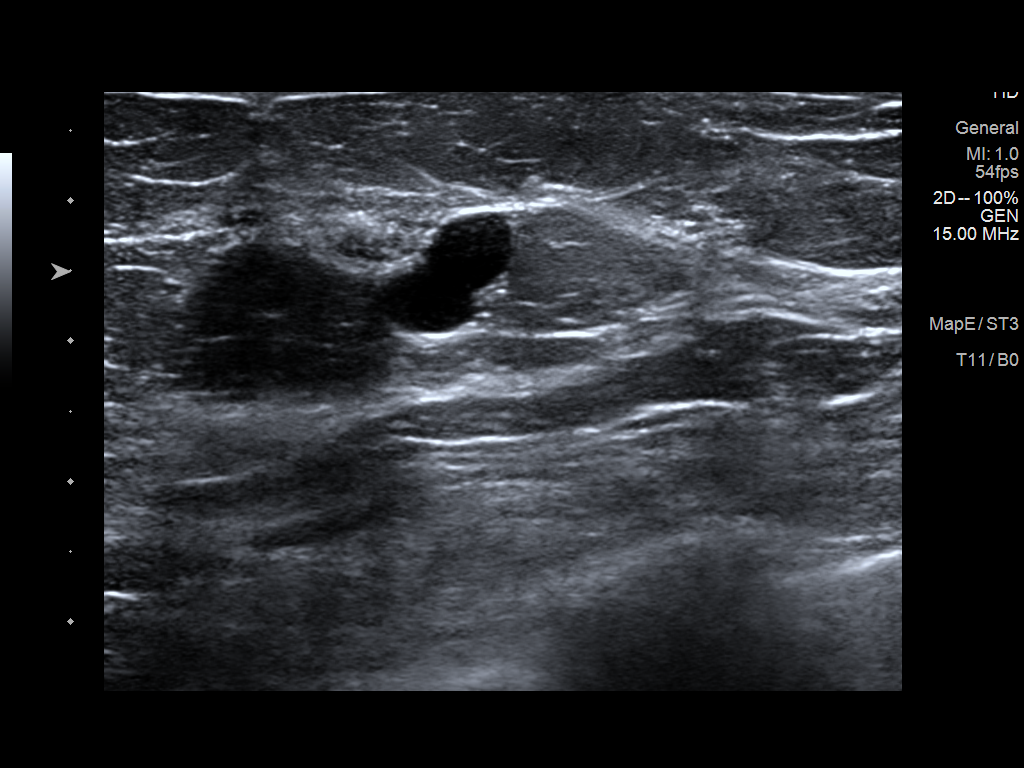
[im 2/6]
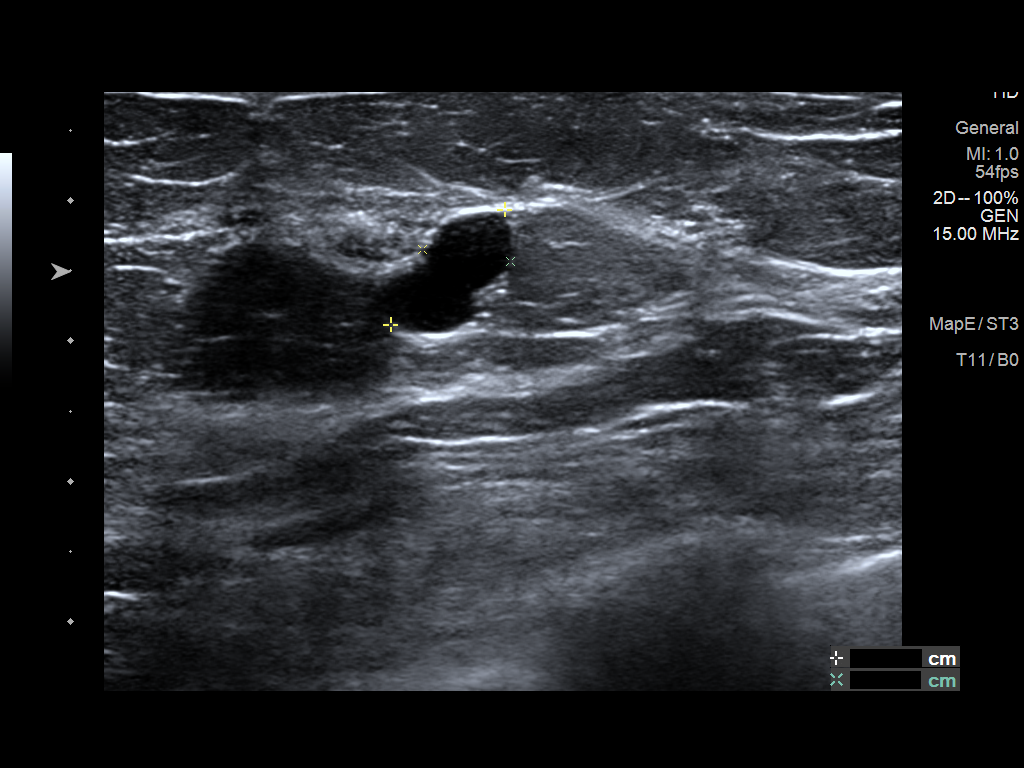
[im 3/6]
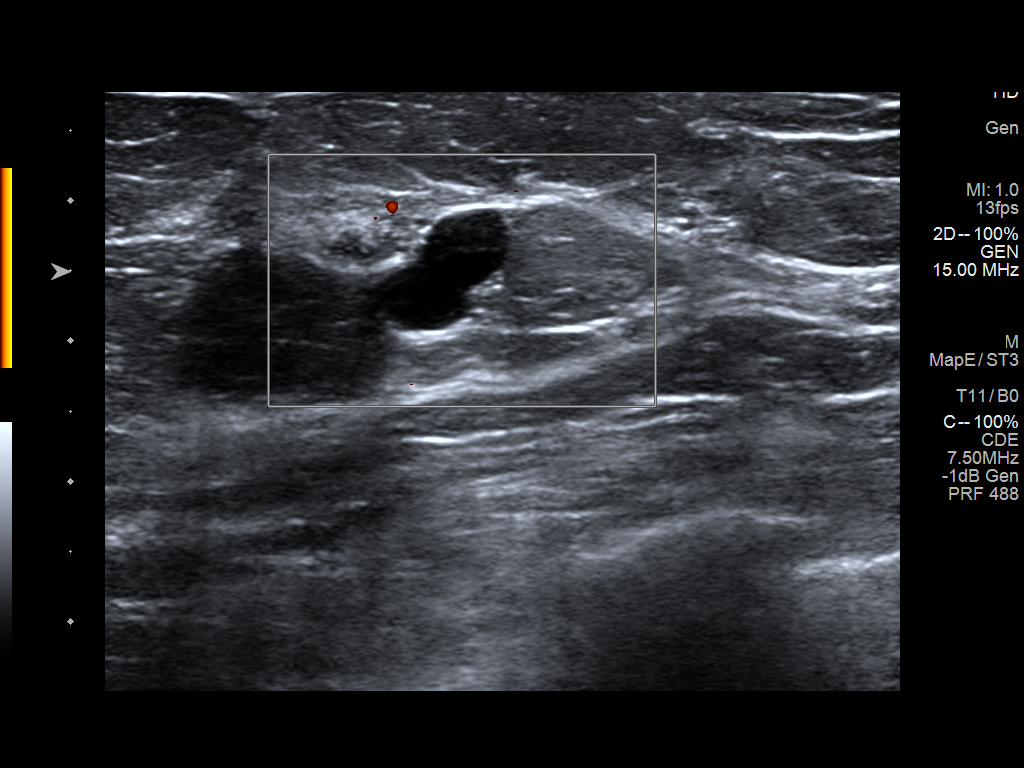
[im 4/6]
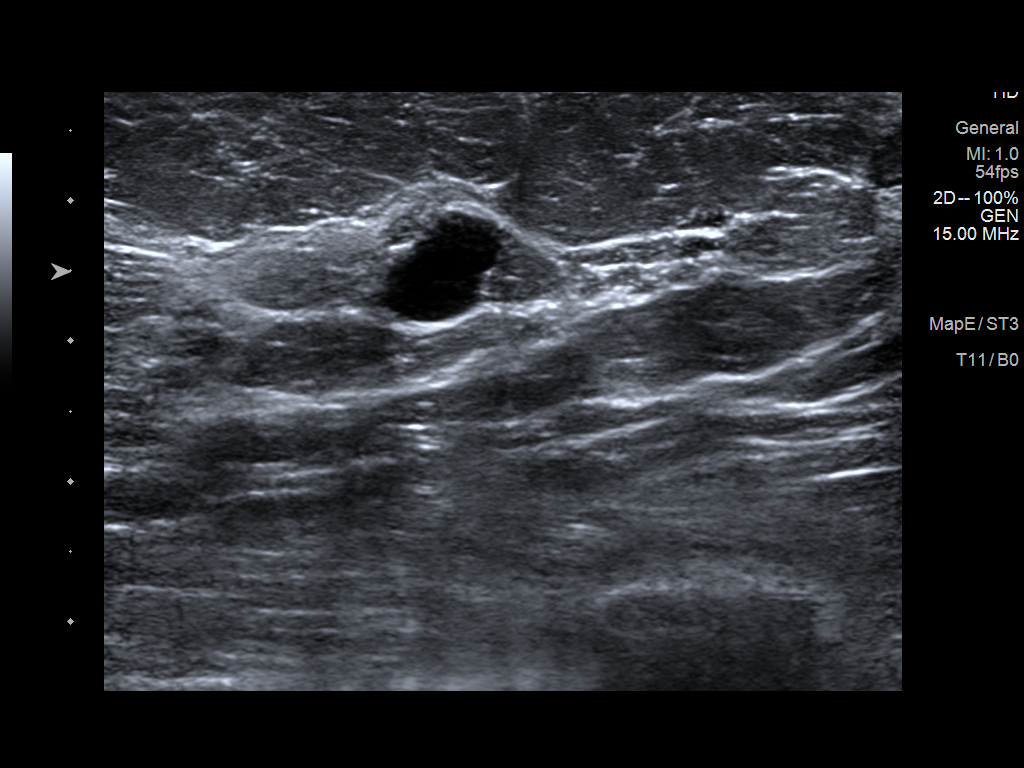
[im 5/6]
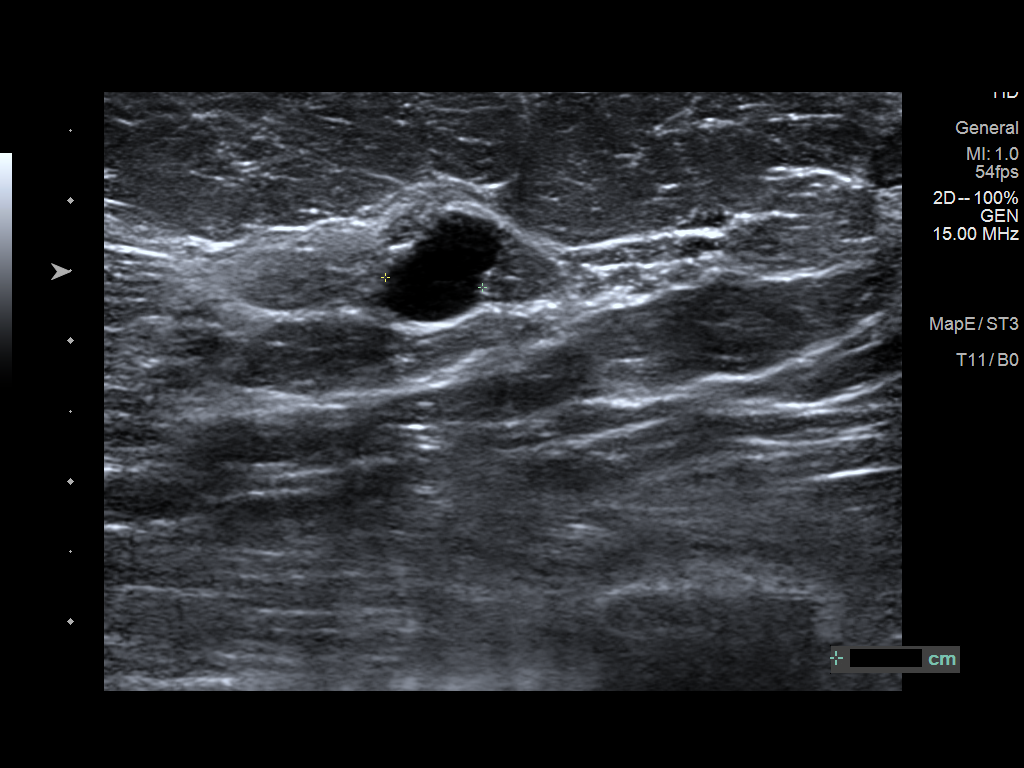
[im 6/6]
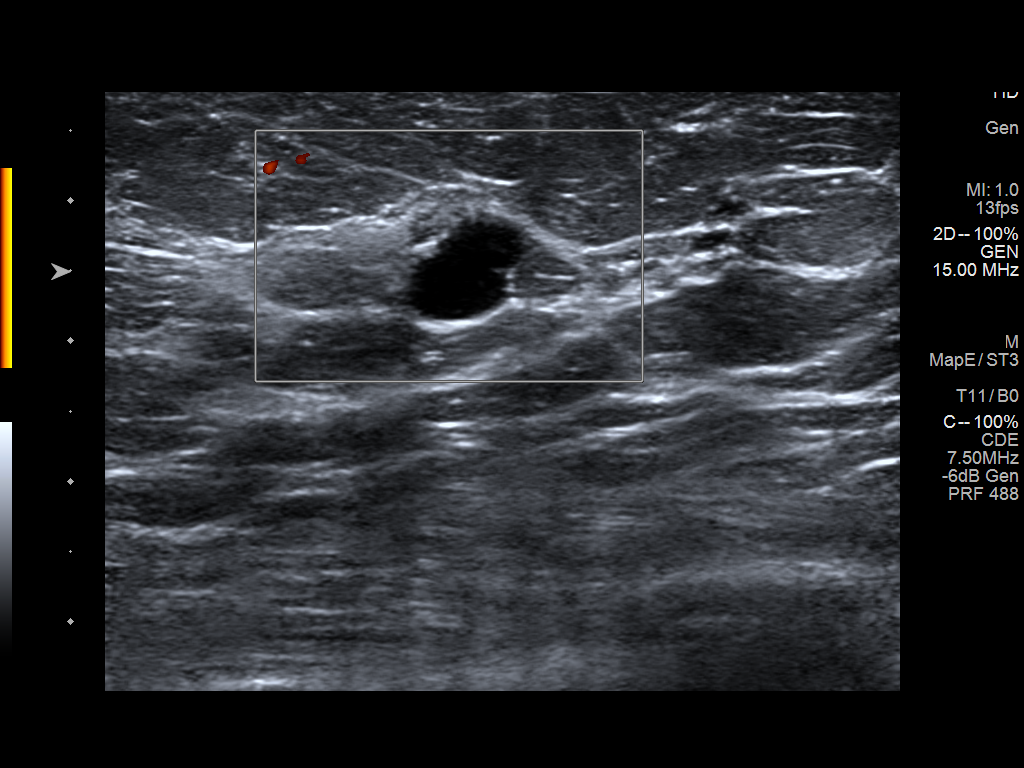

[6 of 6 positions shown; findings below may reference images not displayed]

FINDINGS: Targeted ultrasound is performed, showing a 1.2 x 0.6 x 0.7 cm
benign cyst at the 3 o'clock position of the LEFT breast 1 cm from
the nipple, corresponding to the screening study finding.
IMPRESSION: 1.2 cm benign cyst in the LEFT breast corresponding to the screening
study finding.

RECOMMENDATION:
Bilateral screening mammogram in 1 year.

I have discussed the findings and recommendations with the patient.
If applicable, a reminder letter will be sent to the patient
regarding the next appointment.

BI-RADS CATEGORY  2: Benign.

## 2023-08-14 ENCOUNTER — Other Ambulatory Visit: Payer: Self-pay | Admitting: Obstetrics and Gynecology

## 2023-08-14 DIAGNOSIS — E0789 Other specified disorders of thyroid: Secondary | ICD-10-CM

## 2023-08-17 ENCOUNTER — Ambulatory Visit
Admission: RE | Admit: 2023-08-17 | Discharge: 2023-08-17 | Disposition: A | Discharge status: Home / Self Care | Source: Ambulatory Visit | Attending: Obstetrics and Gynecology | Admitting: Obstetrics and Gynecology

## 2023-08-17 ENCOUNTER — Other Ambulatory Visit: Payer: Self-pay | Admitting: Obstetrics and Gynecology

## 2023-08-17 DIAGNOSIS — E0789 Other specified disorders of thyroid: Secondary | ICD-10-CM

## 2023-08-17 DIAGNOSIS — R928 Other abnormal and inconclusive findings on diagnostic imaging of breast: Secondary | ICD-10-CM

## 2023-08-23 ENCOUNTER — Ambulatory Visit
Admission: RE | Admit: 2023-08-23 | Discharge: 2023-08-23 | Disposition: A | Discharge status: Home / Self Care | Source: Ambulatory Visit | Attending: Obstetrics and Gynecology | Admitting: Obstetrics and Gynecology

## 2023-08-23 ENCOUNTER — Ambulatory Visit
Admission: RE | Admit: 2023-08-23 | Discharge: 2023-08-23 | Discharge status: Home / Self Care | Source: Ambulatory Visit | Attending: Obstetrics and Gynecology | Admitting: Obstetrics and Gynecology

## 2023-08-23 DIAGNOSIS — R928 Other abnormal and inconclusive findings on diagnostic imaging of breast: Secondary | ICD-10-CM

## 2023-09-05 ENCOUNTER — Encounter

## 2023-09-05 ENCOUNTER — Other Ambulatory Visit
